# Patient Record
Sex: Female | Born: 1970 | Race: Asian | Hispanic: No | Marital: Married
Health system: Southern US, Community
[De-identification: ages and names within clinical notes are randomized; demographics above are authoritative.]

---

## 2004-04-07 ENCOUNTER — Ambulatory Visit: Payer: Self-pay | Admitting: Internal Medicine

## 2004-05-05 ENCOUNTER — Ambulatory Visit: Payer: Self-pay | Admitting: Internal Medicine

## 2004-05-08 ENCOUNTER — Other Ambulatory Visit: Admission: RE | Admit: 2004-05-08 | Discharge: 2004-05-08 | Payer: Self-pay | Admitting: Internal Medicine

## 2004-10-27 ENCOUNTER — Other Ambulatory Visit: Admission: RE | Admit: 2004-10-27 | Discharge: 2004-10-27 | Payer: Self-pay | Admitting: Obstetrics and Gynecology

## 2005-03-31 ENCOUNTER — Inpatient Hospital Stay (HOSPITAL_COMMUNITY): Admission: AD | Admit: 2005-03-31 | Discharge: 2005-04-02 | Payer: Self-pay | Admitting: Obstetrics and Gynecology

## 2005-05-13 ENCOUNTER — Ambulatory Visit (HOSPITAL_COMMUNITY): Admission: RE | Admit: 2005-05-13 | Discharge: 2005-05-13 | Payer: Self-pay | Admitting: Obstetrics and Gynecology

## 2005-05-28 ENCOUNTER — Ambulatory Visit (HOSPITAL_COMMUNITY): Admission: RE | Admit: 2005-05-28 | Discharge: 2005-05-28 | Payer: Self-pay | Admitting: Obstetrics and Gynecology

## 2005-07-24 ENCOUNTER — Ambulatory Visit: Payer: Self-pay | Admitting: Internal Medicine

## 2005-09-04 ENCOUNTER — Ambulatory Visit: Payer: Self-pay | Admitting: Family Medicine

## 2005-11-30 ENCOUNTER — Ambulatory Visit: Payer: Self-pay | Admitting: Internal Medicine

## 2006-10-15 ENCOUNTER — Other Ambulatory Visit: Admission: RE | Admit: 2006-10-15 | Discharge: 2006-10-15 | Payer: Self-pay | Admitting: Family Medicine

## 2006-12-07 ENCOUNTER — Encounter: Admission: RE | Admit: 2006-12-07 | Discharge: 2006-12-07 | Payer: Self-pay | Admitting: Family Medicine

## 2007-10-21 ENCOUNTER — Other Ambulatory Visit: Admission: RE | Admit: 2007-10-21 | Discharge: 2007-10-21 | Payer: Self-pay | Admitting: Family Medicine

## 2008-02-07 ENCOUNTER — Encounter: Admission: RE | Admit: 2008-02-07 | Discharge: 2008-02-07 | Payer: Self-pay | Admitting: Family Medicine

## 2008-02-22 ENCOUNTER — Encounter: Admission: RE | Admit: 2008-02-22 | Discharge: 2008-02-22 | Payer: Self-pay | Admitting: Family Medicine

## 2008-02-22 ENCOUNTER — Other Ambulatory Visit: Admission: RE | Admit: 2008-02-22 | Discharge: 2008-02-22 | Payer: Self-pay | Admitting: Interventional Radiology

## 2008-02-22 ENCOUNTER — Encounter (INDEPENDENT_AMBULATORY_CARE_PROVIDER_SITE_OTHER): Payer: Self-pay | Admitting: Interventional Radiology

## 2008-03-02 ENCOUNTER — Encounter: Admission: RE | Admit: 2008-03-02 | Discharge: 2008-03-02 | Payer: Self-pay | Admitting: Family Medicine

## 2008-06-22 ENCOUNTER — Encounter: Admission: RE | Admit: 2008-06-22 | Discharge: 2008-06-22 | Payer: Self-pay | Admitting: Family Medicine

## 2009-02-15 ENCOUNTER — Other Ambulatory Visit: Admission: RE | Admit: 2009-02-15 | Discharge: 2009-02-15 | Payer: Self-pay | Admitting: Family Medicine

## 2009-05-01 ENCOUNTER — Encounter: Admission: RE | Admit: 2009-05-01 | Discharge: 2009-05-01 | Payer: Self-pay | Admitting: Family Medicine

## 2009-05-16 ENCOUNTER — Ambulatory Visit (HOSPITAL_COMMUNITY): Admission: RE | Admit: 2009-05-16 | Discharge: 2009-05-16 | Payer: Self-pay | Admitting: Family Medicine

## 2010-02-17 ENCOUNTER — Other Ambulatory Visit: Admission: RE | Admit: 2010-02-17 | Discharge: 2010-02-17 | Payer: Self-pay | Admitting: Family Medicine

## 2010-04-03 ENCOUNTER — Encounter: Admission: RE | Admit: 2010-04-03 | Discharge: 2010-04-03 | Payer: Self-pay | Admitting: Family Medicine

## 2010-05-05 ENCOUNTER — Encounter (HOSPITAL_COMMUNITY)
Admission: RE | Admit: 2010-05-05 | Discharge: 2010-06-24 | Payer: Self-pay | Source: Home / Self Care | Attending: Endocrinology | Admitting: Endocrinology

## 2010-06-16 ENCOUNTER — Encounter: Payer: Self-pay | Admitting: Family Medicine

## 2010-08-05 LAB — HCG, SERUM, QUALITATIVE: Preg, Serum: NEGATIVE

## 2010-10-10 NOTE — Discharge Summary (Signed)
NAMEMarland Kitchen  [place], [name] NO.:  [id number]   MEDICAL RECORD NO.:  [id number]          PATIENT TYPE:  INP   LOCATION:  9112                          FACILITY:  [facility]   PHYSICIAN:  Zenaida Niece, M.D.DATE OF BIRTH:  1970-08-16   DATE OF ADMISSION:  03/31/2005  DATE OF DISCHARGE:  04/02/2005                                 DISCHARGE SUMMARY   ADMISSION DIAGNOSES:  1.  Intrauterine pregnancy at 38 weeks.  2.  Previous cesarean section.  3.  Advanced maternal age.   DISCHARGE DIAGNOSES:  1.  Intrauterine pregnancy at 38 weeks.  2.  Previous cesarean section.  3.  Advanced maternal age.   PROCEDURES:  On March 31, 2005, she had a vaginal birth after cesarean  section.   HISTORY AND PHYSICAL:  This is a 40 year old Asian female gravida 2 para 1-0-  0-1 with an EGA of 38+ weeks who presents with complaint of ruptured  membranes and regular contractions. Evaluation in triage revealed possible  ruptured membranes but she was 9 cm dilated. Prenatal care complicated by  prior low transverse cesarean section for which she is cleared for trial of  labor and had a repeat cesarean section was scheduled for November 9. She  wishes to proceed with VBAC since she is in labor. She has advanced maternal  age with normal first trimester screening, a normal second trimester  ultrasound. Prenatal labs:  Blood type is A positive with a negative  antibody screen, RPR nonreactive, rubella immune, hepatitis B surface  antigen negative, HIV negative, gonorrhea and chlamydia negative, 1-hour  Glucola 98, and group B strep is negative. Past OB history:  In 2002, a low  transverse cesarean section at 40 weeks, 6 pounds 12 ounces, done for  nonreassuring fetal heart tracing. The remainder her history is  noncontributory. Physical exam:  She is afebrile with stable vital signs.  Fetal heart tracing reactive with contractions every 2-3 minutes. Cervix, on  my first exam, she was complete,  complete and +2 with a vertex presentation  and an adequate pelvis, and abdomen was gravid, nontender, with an estimated  fetal weight of 7 pounds and well-healed transverse scar.   HOSPITAL COURSE:  The patient was admitted, progressed to complete and  pushed well. She had a VBAC of a viable female infant with Apgars of 9 and 9  that weighed 6 pounds 13 ounces. Placenta delivered spontaneous, was intact,  and uterus palpated normal. She had a second-degree laceration and right  labial laceration repaired with 3-0 Vicryl with local block. Estimated blood  loss was 500 cc. She was given IV and IM Pitocin as her IV infiltrated.  Postpartum, she had no significant complications. Predelivery hemoglobin  12.4, postdelivery 10.5. On postpartum day #2, she was felt to be stable  enough for discharge home.   DISCHARGE INSTRUCTIONS:  Regular diet, pelvic rest, and no strenuous  activity. Follow-up is in approximately 6 weeks. Medications are over-the-  counter ibuprofen as needed and over-the-counter iron sulfate daily. She is  also given our discharge pamphlet.  Zenaida Niece, M.D.  Electronically Signed     TDM/MEDQ  D:  04/20/2005  T:  04/20/2005  Job:  [id number]

## 2010-10-10 NOTE — H&P (Signed)
NAME:  [name], [name] NO.:  [id number]   MEDICAL RECORD NO.:  [id number]          PATIENT TYPE:  INP   LOCATION:  NA                            [facility]ACILITY:  [facility]   PHYSICIAN:  Huel Cote, M.D. DATE OF BIRTH:  May 16, 1971   DATE OF ADMISSION:  04/02/2005  DATE OF DISCHARGE:                                HISTORY & PHYSICAL   HISTORY OF PRESENT ILLNESS:  The patient is a 40 year old G2, P1-0-0-1, who  is coming in at approximately [redacted] weeks gestation for a scheduled repeat  cesarean section.  The patient's due date is April 10, 2005 based on an  early 9-week ultrasound and consistent with her last menstrual period.  Her  prenatal care was complicated by a history of advanced maternal age with a  normal first trimester screening.  She also had a previous low transverse  cesarean section with a one layer closure and was counseled as to the pluses  and minuses of repeat cesarean section versus vaginal birth after cesarean  section, and desired to proceed with a scheduled cesarean section.   PRENATAL LABORATORIES:  A positive.  Antibody negative.  RPR nonreactive.  Rubella immune.  Hepatitis B surface antigen negative.  HIV negative.  GC  negative.  Chlamydia negative.  Triple screen normal.  Group B strep  negative.  One hour Glucola normal at 98.   PAST OBSTETRICAL HISTORY:  Significant for a cesarean section in June of  2002 of a 6 pound, 12 ounce infant for fetal heart rate decelerations.  As  stated, this was a low transverse cesarean section with a one layer closure  by the operative report.   PAST MEDICAL HISTORY:  None.   PAST SURGICAL HISTORY:  The cesarean section in 2002 only.   PAST GYNECOLOGIC HISTORY:  No abnormal Pap smears.   PHYSICAL EXAMINATION:  VITAL SIGNS:  The patient's weight is 165 pounds,  blood pressure 100/60.  CARDIAC:  Regular rate and rhythm.  LUNGS:  Clear.  ABDOMEN:  Soft, gravid, nontender.  PELVIC:  Cervix is 50% effaced, 2  cm, and -2 station with a vertex  presenting.   The patient was counseled of the risks and benefits of proceeding with  cesarean section including bleeding and infection and possible damage to  bowel and bladder.  The patient understands these risks and desires to  proceed with the surgery as scheduled.  We will proceed with a repeat low  transverse cesarean section.  She declines any sterilization procedure at  the time of surgery.      Huel Cote, M.D.  Electronically Signed    KR/MEDQ  D:  03/30/2005  T:  03/30/2005  Job:  [id number]

## 2011-02-11 ENCOUNTER — Other Ambulatory Visit: Payer: Self-pay | Admitting: Endodontics

## 2011-03-13 ENCOUNTER — Other Ambulatory Visit: Payer: Self-pay | Admitting: Family Medicine

## 2011-03-13 DIAGNOSIS — K824 Cholesterolosis of gallbladder: Secondary | ICD-10-CM

## 2011-03-25 ENCOUNTER — Ambulatory Visit
Admission: RE | Admit: 2011-03-25 | Discharge: 2011-03-25 | Disposition: A | Discharge status: Home / Self Care | Payer: BC Managed Care – PPO | Source: Ambulatory Visit | Attending: Family Medicine | Admitting: Family Medicine

## 2011-03-25 DIAGNOSIS — K824 Cholesterolosis of gallbladder: Secondary | ICD-10-CM

## 2012-03-22 ENCOUNTER — Other Ambulatory Visit: Payer: Self-pay | Admitting: Gastroenterology

## 2012-04-05 ENCOUNTER — Other Ambulatory Visit (HOSPITAL_COMMUNITY)
Admission: RE | Admit: 2012-04-05 | Discharge: 2012-04-05 | Disposition: A | Discharge status: Home / Self Care | Payer: BC Managed Care – PPO | Source: Ambulatory Visit | Attending: Family Medicine | Admitting: Family Medicine

## 2012-04-05 ENCOUNTER — Other Ambulatory Visit: Payer: Self-pay | Admitting: Family Medicine

## 2012-04-05 DIAGNOSIS — Z124 Encounter for screening for malignant neoplasm of cervix: Secondary | ICD-10-CM | POA: Insufficient documentation

## 2012-04-08 ENCOUNTER — Other Ambulatory Visit: Payer: Self-pay | Admitting: Family Medicine

## 2012-04-08 DIAGNOSIS — Z1231 Encounter for screening mammogram for malignant neoplasm of breast: Secondary | ICD-10-CM

## 2012-05-16 ENCOUNTER — Ambulatory Visit
Admission: RE | Admit: 2012-05-16 | Discharge: 2012-05-16 | Disposition: A | Discharge status: Home / Self Care | Payer: BC Managed Care – PPO | Source: Ambulatory Visit | Attending: Family Medicine | Admitting: Family Medicine

## 2012-05-16 DIAGNOSIS — Z1231 Encounter for screening mammogram for malignant neoplasm of breast: Secondary | ICD-10-CM

## 2013-05-04 ENCOUNTER — Other Ambulatory Visit (HOSPITAL_COMMUNITY): Payer: Self-pay | Admitting: Endocrinology

## 2013-05-04 DIAGNOSIS — C73 Malignant neoplasm of thyroid gland: Secondary | ICD-10-CM

## 2013-06-01 ENCOUNTER — Other Ambulatory Visit: Payer: Self-pay | Admitting: Gastroenterology

## 2013-06-01 DIAGNOSIS — R1310 Dysphagia, unspecified: Secondary | ICD-10-CM

## 2013-06-12 ENCOUNTER — Ambulatory Visit (HOSPITAL_COMMUNITY): Payer: BC Managed Care – PPO

## 2013-06-12 ENCOUNTER — Encounter (HOSPITAL_COMMUNITY)
Admission: RE | Admit: 2013-06-12 | Discharge: 2013-06-12 | Disposition: A | Discharge status: Home / Self Care | Payer: BC Managed Care – PPO | Source: Ambulatory Visit | Attending: Endocrinology | Admitting: Endocrinology

## 2013-06-12 DIAGNOSIS — C73 Malignant neoplasm of thyroid gland: Secondary | ICD-10-CM | POA: Insufficient documentation

## 2013-06-12 MED ORDER — THYROTROPIN ALFA 1.1 MG IM SOLR
0.9000 mg | INTRAMUSCULAR | Status: AC
  Administered 2013-06-12: 0.9 mg via INTRAMUSCULAR

## 2013-06-13 ENCOUNTER — Encounter (HOSPITAL_COMMUNITY): Payer: BC Managed Care – PPO

## 2013-06-13 MED ORDER — THYROTROPIN ALFA 1.1 MG IM SOLR
0.9000 mg | INTRAMUSCULAR | Status: AC
  Administered 2013-06-13: 0.9 mg via INTRAMUSCULAR

## 2013-06-14 ENCOUNTER — Encounter (HOSPITAL_COMMUNITY)
Admission: RE | Admit: 2013-06-14 | Discharge: 2013-06-14 | Disposition: A | Discharge status: Home / Self Care | Payer: BC Managed Care – PPO | Source: Ambulatory Visit | Attending: Endocrinology | Admitting: Endocrinology

## 2013-06-14 LAB — HCG, SERUM, QUALITATIVE: Preg, Serum: NEGATIVE

## 2013-06-14 MED ORDER — SODIUM IODIDE I 131 CAPSULE
4.0000 | Freq: Once | INTRAVENOUS | Status: AC | PRN
  Administered 2013-06-14: 4 via ORAL

## 2013-06-16 ENCOUNTER — Encounter (HOSPITAL_COMMUNITY)
Admission: RE | Admit: 2013-06-16 | Discharge: 2013-06-16 | Disposition: A | Discharge status: Home / Self Care | Payer: BC Managed Care – PPO | Source: Ambulatory Visit | Attending: Endocrinology | Admitting: Endocrinology

## 2013-06-16 DIAGNOSIS — C73 Malignant neoplasm of thyroid gland: Secondary | ICD-10-CM | POA: Insufficient documentation

## 2013-06-19 ENCOUNTER — Ambulatory Visit (HOSPITAL_COMMUNITY): Payer: BC Managed Care – PPO

## 2013-06-21 ENCOUNTER — Ambulatory Visit (HOSPITAL_COMMUNITY): Payer: BC Managed Care – PPO

## 2013-06-28 ENCOUNTER — Ambulatory Visit (HOSPITAL_COMMUNITY)
Admission: RE | Admit: 2013-06-28 | Discharge: 2013-06-28 | Disposition: A | Discharge status: Home / Self Care | Payer: BC Managed Care – PPO | Source: Ambulatory Visit | Attending: Gastroenterology | Admitting: Gastroenterology

## 2013-06-28 DIAGNOSIS — Z85850 Personal history of malignant neoplasm of thyroid: Secondary | ICD-10-CM | POA: Insufficient documentation

## 2013-06-28 DIAGNOSIS — R1310 Dysphagia, unspecified: Secondary | ICD-10-CM | POA: Insufficient documentation

## 2015-02-13 IMAGING — RF DG ESOPHAGUS
11 of 17 series · 14 of 24 positions shown · non-contrast
Comparison: NM WHOLE BODY I131 SCAN W/THYROGEN dated 06/16/2013

FLUOROSCOPY TIME:  54 seconds

CLINICAL DATA: Dysphagia. History of thyroid cancer with vocal cord
implant.

EXAM:
ESOPHOGRAM / BARIUM SWALLOW / BARIUM TABLET STUDY
TECHNIQUE: Combined double contrast and single contrast examination performed
using effervescent crystals, thick barium liquid, and thin barium
liquid. The patient was observed with fluoroscopy swallowing a 13mm
barium sulphate tablet.

[Series 1: run · 1 of 1 slices shown (1 of 11)]
[im 1/1]
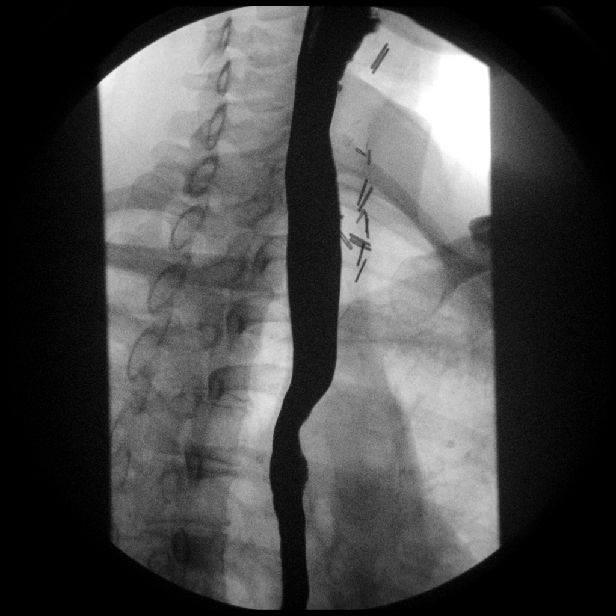

[Series 3: run · 1 of 1 slices shown (2 of 11)]
[im 1/1]
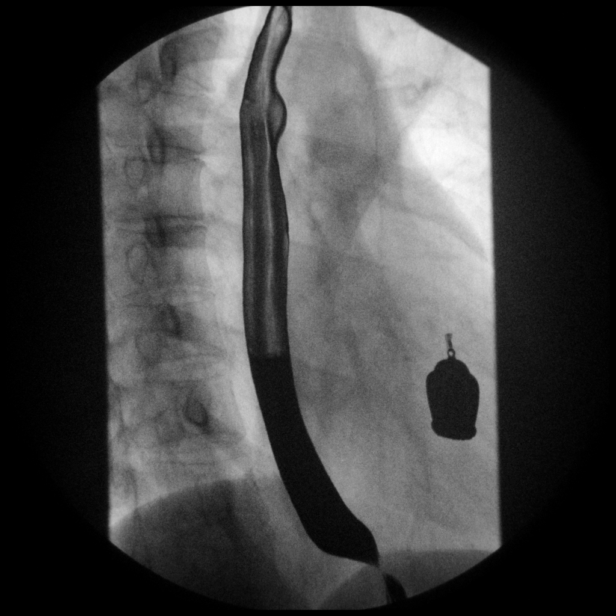

[Series 4: run · 2 of 6 slices shown (3 of 11)]
[im 2/6]
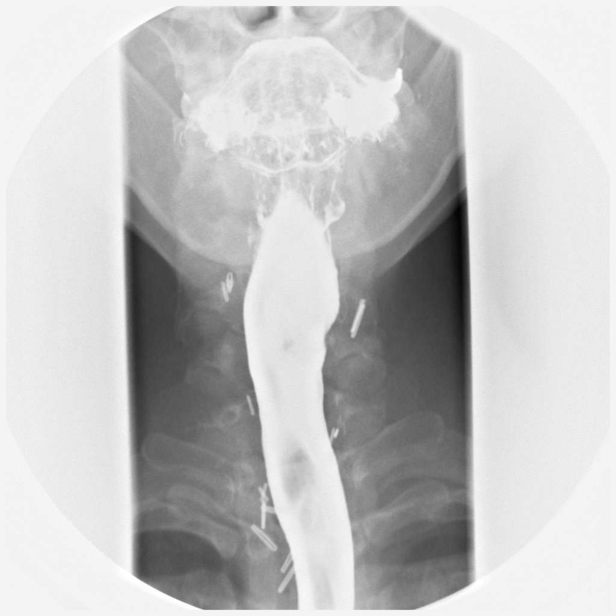
[im 6/6]
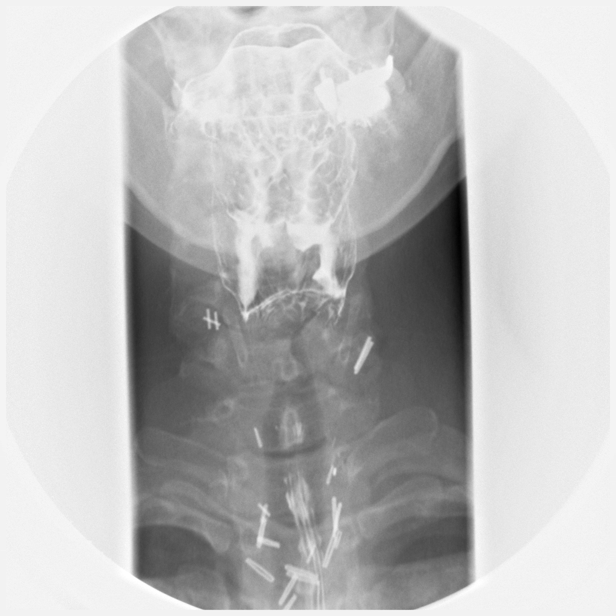

[Series 5: run · 3 of 7 slices shown (4 of 11)]
[im 1/7]
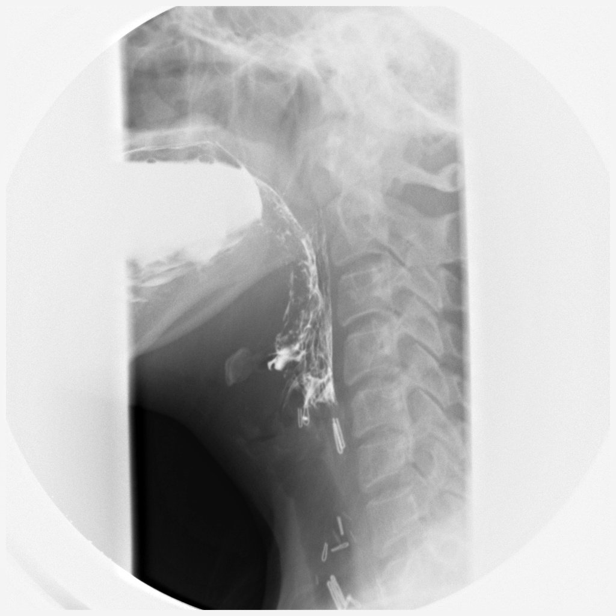
[im 4/7]
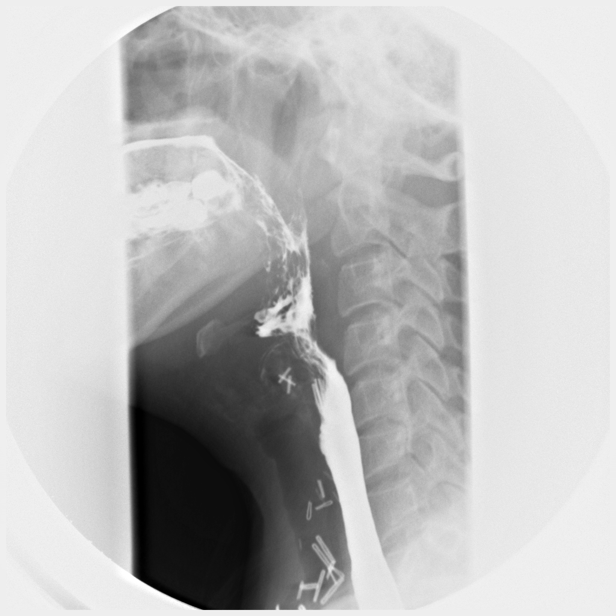
[im 7/7]
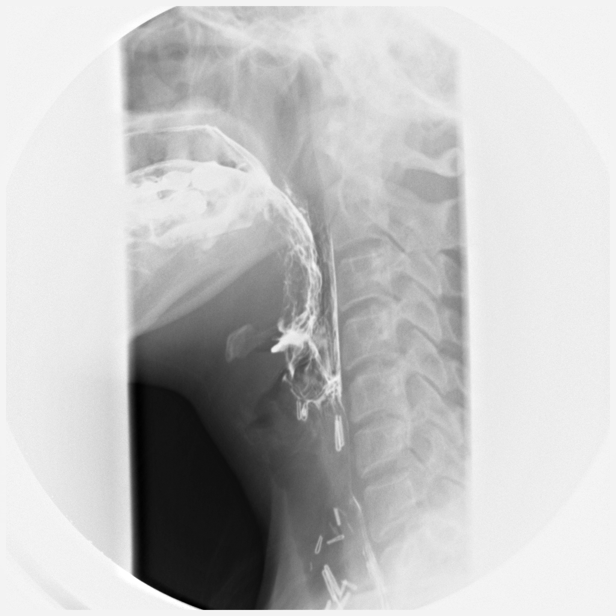

[Series 6: run · 1 of 1 slices shown (5 of 11)]
[im 1/1]
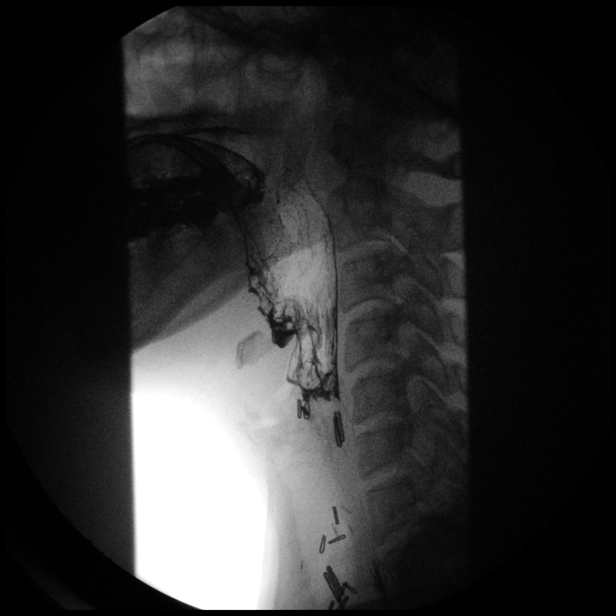

[Series 8: run · 1 of 1 slices shown (6 of 11)]
[im 1/1]
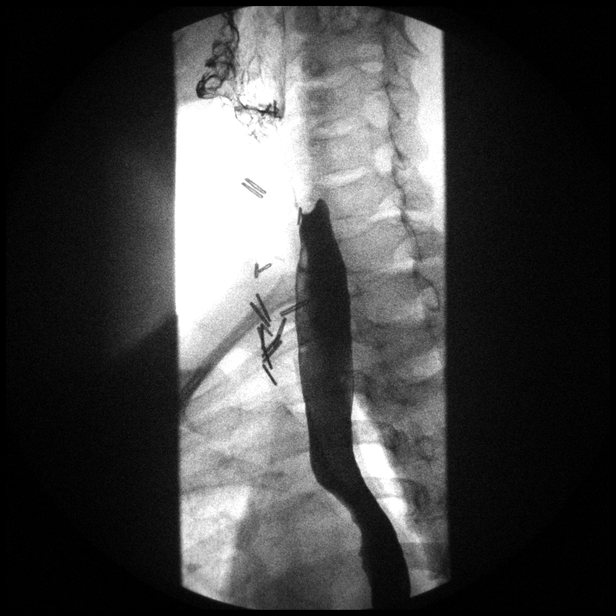

[Series 10: run · 1 of 1 slices shown (7 of 11)]
[im 1/1]
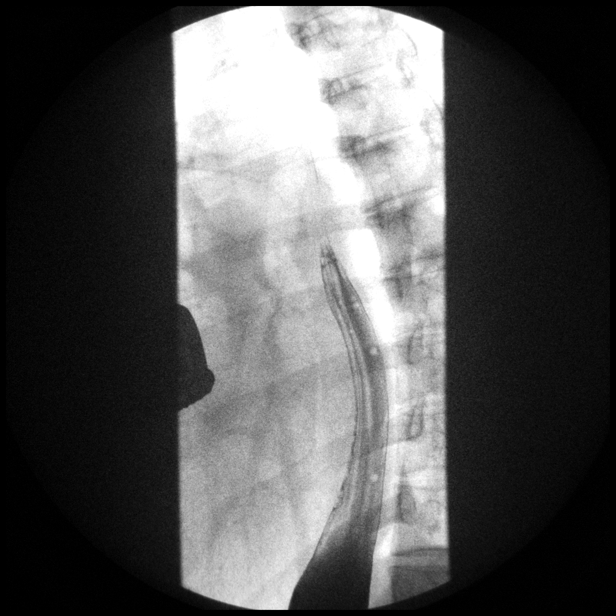

[Series 12: run · 1 of 1 slices shown (8 of 11)]
[im 1/1]
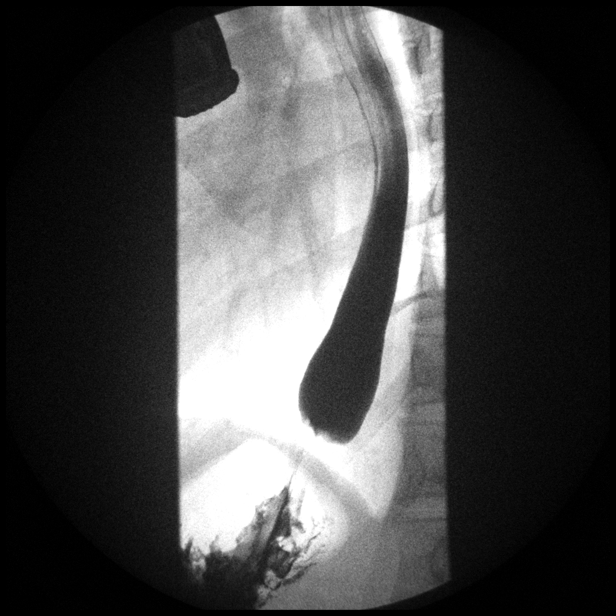

[Series 13: run · 1 of 1 slices shown (9 of 11)]
[im 1/1]
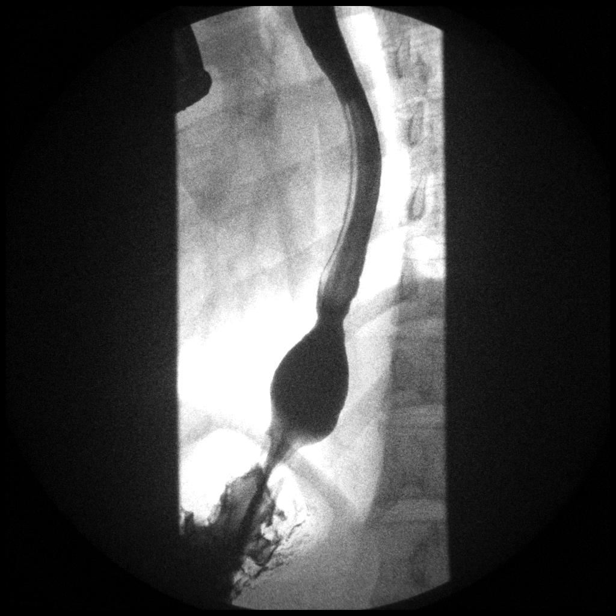

[Series 15: run · 1 of 1 slices shown (10 of 11)]
[im 1/1]
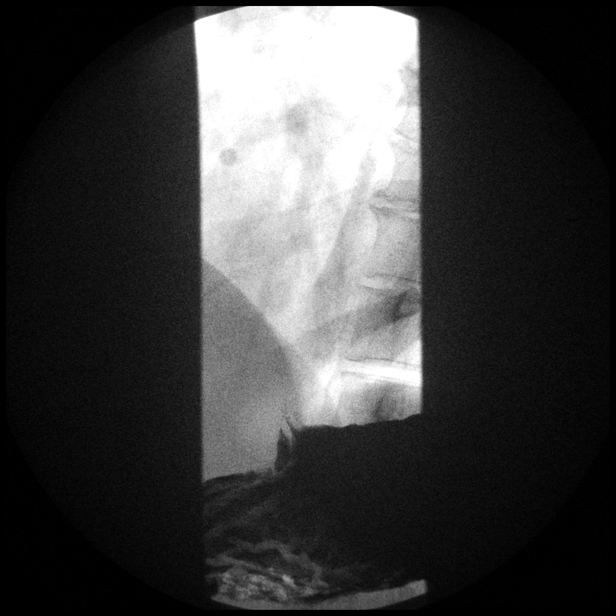

[Series 17: run · 1 of 1 slices shown (11 of 11)]
[im 1/1]
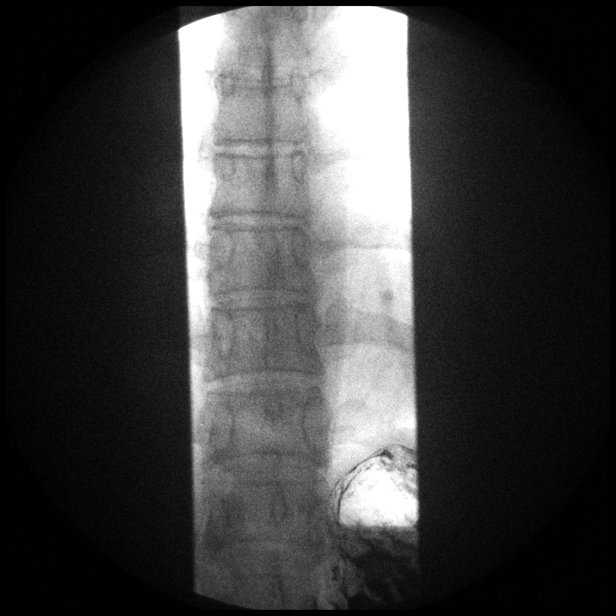

[14 of 24 positions shown; findings below may reference images not displayed]

FINDINGS: There are surgical clips at the thoracic inlet consistent with prior
thyroidectomy. Rapid sequence imaging of the pharynx demonstrates no
mucosal abnormalities or laryngeal penetration.

The esophageal motility is within normal limits. There is no
stricture, mass or ulceration. No reflux was elicited with the water
siphon test. A barium tablet passed without delay into the stomach.
IMPRESSION: Normal esophagram.
